# Patient Record
Sex: Female | Born: 1980 | Hispanic: No | Marital: Single | Smoking: Never smoker
Health system: Southern US, Community
[De-identification: ages and names within clinical notes are randomized; demographics above are authoritative.]

## PROBLEM LIST (undated history)

## (undated) HISTORY — PX: WISDOM TOOTH EXTRACTION: SHX21

---

## 2004-07-05 ENCOUNTER — Emergency Department (HOSPITAL_COMMUNITY): Admission: EM | Admit: 2004-07-05 | Discharge: 2004-07-05 | Payer: Self-pay

## 2012-11-10 ENCOUNTER — Emergency Department (HOSPITAL_COMMUNITY): Payer: No Typology Code available for payment source

## 2012-11-10 ENCOUNTER — Emergency Department (HOSPITAL_COMMUNITY)
Admission: EM | Admit: 2012-11-10 | Discharge: 2012-11-11 | Disposition: A | Discharge status: Home / Self Care | Payer: No Typology Code available for payment source | Attending: Emergency Medicine | Admitting: Emergency Medicine

## 2012-11-10 ENCOUNTER — Encounter (HOSPITAL_COMMUNITY): Payer: Self-pay

## 2012-11-10 DIAGNOSIS — Y9389 Activity, other specified: Secondary | ICD-10-CM | POA: Insufficient documentation

## 2012-11-10 DIAGNOSIS — R109 Unspecified abdominal pain: Secondary | ICD-10-CM | POA: Insufficient documentation

## 2012-11-10 DIAGNOSIS — R0789 Other chest pain: Secondary | ICD-10-CM | POA: Insufficient documentation

## 2012-11-10 DIAGNOSIS — S20219A Contusion of unspecified front wall of thorax, initial encounter: Secondary | ICD-10-CM | POA: Insufficient documentation

## 2012-11-10 DIAGNOSIS — Y92410 Unspecified street and highway as the place of occurrence of the external cause: Secondary | ICD-10-CM | POA: Insufficient documentation

## 2012-11-10 LAB — BASIC METABOLIC PANEL
CO2: 23 mEq/L (ref 19–32)
Chloride: 103 mEq/L (ref 96–112)
GFR calc Af Amer: >90 mL/min (ref >90)
Glucose, Bld: 160 mg/dL — ABNORMAL HIGH (ref 70–99)
Sodium: 136 mEq/L (ref 135–145)

## 2012-11-10 LAB — CBC WITH DIFFERENTIAL/PLATELET
Basophils Relative: 0 % (ref 0–1)
Eosinophils Absolute: 0.1 10*3/uL (ref 0.0–0.7)
HCT: 32.4 % — ABNORMAL LOW (ref 36.0–46.0)
Lymphocytes Relative: 19 % (ref 12–46)
Lymphs Abs: 2.3 10*3/uL (ref 0.7–4.0)
MCH: 30.6 pg (ref 26.0–34.0)
MCHC: 34 g/dL (ref 30.0–36.0)
MCV: 90.3 fL (ref 78.0–100.0)
Neutro Abs: 8.8 10*3/uL — ABNORMAL HIGH (ref 1.7–7.7)
RBC: 3.59 MIL/uL — ABNORMAL LOW (ref 3.87–5.11)
RDW: 13 % (ref 11.5–15.5)
WBC: 12.2 10*3/uL — ABNORMAL HIGH (ref 4.0–10.5)

## 2012-11-10 NOTE — ED Notes (Signed)
I gave the patient a warm blanket. 

## 2012-11-10 NOTE — ED Notes (Signed)
RR OB at bedside  
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
## 2012-11-11 ENCOUNTER — Emergency Department (HOSPITAL_COMMUNITY): Payer: No Typology Code available for payment source

## 2012-11-11 LAB — CBC
Hemoglobin: 10.7 g/dL — ABNORMAL LOW (ref 12.0–15.0)
MCV: 91 fL (ref 78.0–100.0)
RBC: 3.45 MIL/uL — ABNORMAL LOW (ref 3.87–5.11)
RDW: 13.2 % (ref 11.5–15.5)

## 2012-11-11 NOTE — Progress Notes (Signed)
Notified Dr. Macon Large with OB ultrasound results.  Patient is discharged OB status. Patient needs to notify Primary OB 11-11-2012 morning to have follow-up 11-11-2012.

## 2012-11-11 NOTE — ED Notes (Signed)
Pt dc to home.  Pt states understanding to dc instructions.   Pt ambulatory to exit without difficulty.  Pt denied need for w/c 

## 2012-11-11 NOTE — Progress Notes (Signed)
Given status update to [employer]

## 2012-11-11 NOTE — ED Provider Notes (Signed)
  Physical Exam  BP 121/70  Pulse 92  Temp 97.5 F (36.4 C) (Oral)  Resp 18  Ht 5\' 3"  (1.6 m)  Wt 150 lb (68.04 kg)  BMI 26.57 kg/m2  SpO2 99%  Physical Exam  ED Course  Procedures  MDM Change of shift, patient has been ambulatory with minimal difficulty, OB has cleared the patient for discharge off of the fetal monitor, repeat CBC shows no significant drop in clubbing, patient appears stable for discharge.      Vida Roller, MD 11/11/12 [phone]
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
## 2012-11-11 NOTE — Progress Notes (Signed)
Monitors off at this time

## 2012-11-11 NOTE — Progress Notes (Signed)
Orders placed for OB limited ultrasound

## 2013-09-15 ENCOUNTER — Encounter (HOSPITAL_COMMUNITY): Payer: Self-pay | Admitting: *Deleted

## 2014-09-28 ENCOUNTER — Encounter (HOSPITAL_COMMUNITY): Payer: Self-pay | Admitting: *Deleted

## 2021-04-29 ENCOUNTER — Encounter: Payer: Self-pay | Admitting: Neurology
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
## 2021-07-20 ENCOUNTER — Encounter: Payer: Self-pay | Admitting: Neurology

## 2021-07-20 ENCOUNTER — Ambulatory Visit (INDEPENDENT_AMBULATORY_CARE_PROVIDER_SITE_OTHER): Payer: BLUE CROSS/BLUE SHIELD | Admitting: Neurology

## 2021-07-20 ENCOUNTER — Other Ambulatory Visit: Payer: Self-pay

## 2021-07-20 VITALS — BP 129/57 | HR 70 | Ht 63.0 in | Wt 159.6 lb

## 2021-07-20 DIAGNOSIS — G43019 Migraine without aura, intractable, without status migrainosus: Secondary | ICD-10-CM | POA: Diagnosis not present

## 2021-07-20 DIAGNOSIS — M5481 Occipital neuralgia: Secondary | ICD-10-CM

## 2021-07-20 MED ORDER — SUMATRIPTAN SUCCINATE 50 MG PO TABS: ORAL_TABLET | ORAL | 5 refills | Status: AC

## 2021-07-20 MED ORDER — GABAPENTIN 100 MG PO CAPS: 100.0000 mg | ORAL_CAPSULE | Freq: Three times a day (TID) | ORAL | 5 refills | Status: AC | PRN

## 2021-07-20 NOTE — Patient Instructions (Addendum)
Take sumatriptan 50mg  at earliest onset of headache.  May repeat every 2 hours.  Maximum 4 tablets in 24 hours. If the burning and numbness in back of head is too painful, may take gabapentin 100mg  as directed If headaches become more frequent, contact me and we can start a daily medication.    1. U?ng sumatriptan 50mg  khi b?t ??u nh?c ??u s?m nh?t. C th? l?p l?i sau m?i 2 gi?. T?i ?a 4 vin trong 24 gi?. 2. N?u ?au rt v t sau ??u, c th? dng gabapentin 100mg  theo ch? d?n 3. N?u c?n ?au ??u tr? nn th??ng xuyn h?n, hy lin h? v?i ti v chng ti c th? b?t ??u dng thu?c hng ngy.

## 2021-11-25 ENCOUNTER — Emergency Department (HOSPITAL_BASED_OUTPATIENT_CLINIC_OR_DEPARTMENT_OTHER): Payer: BLUE CROSS/BLUE SHIELD

## 2021-11-25 ENCOUNTER — Emergency Department (HOSPITAL_BASED_OUTPATIENT_CLINIC_OR_DEPARTMENT_OTHER)
Admission: EM | Admit: 2021-11-25 | Discharge: 2021-11-25 | Disposition: A | Discharge status: Home / Self Care | Payer: BLUE CROSS/BLUE SHIELD | Attending: Emergency Medicine | Admitting: Emergency Medicine

## 2021-11-25 ENCOUNTER — Other Ambulatory Visit: Payer: Self-pay

## 2021-11-25 ENCOUNTER — Encounter (HOSPITAL_BASED_OUTPATIENT_CLINIC_OR_DEPARTMENT_OTHER): Payer: Self-pay | Admitting: Emergency Medicine

## 2021-11-25 DIAGNOSIS — R519 Headache, unspecified: Secondary | ICD-10-CM | POA: Diagnosis present

## 2021-11-25 DIAGNOSIS — R42 Dizziness and giddiness: Secondary | ICD-10-CM | POA: Insufficient documentation

## 2021-11-25 DIAGNOSIS — N9489 Other specified conditions associated with female genital organs and menstrual cycle: Secondary | ICD-10-CM | POA: Insufficient documentation

## 2021-11-25 DIAGNOSIS — U071 COVID-19: Secondary | ICD-10-CM | POA: Diagnosis not present

## 2021-11-25 LAB — COMPREHENSIVE METABOLIC PANEL
ALT: 17 U/L (ref 0–44)
AST: 14 U/L — ABNORMAL LOW (ref 15–41)
Albumin: 4.1 g/dL (ref 3.5–5.0)
Alkaline Phosphatase: 56 U/L (ref 38–126)
Anion gap: 8 (ref 5–15)
BUN: 10 mg/dL (ref 6–20)
CO2: 25 mmol/L (ref 22–32)
Calcium: 8.8 mg/dL — ABNORMAL LOW (ref 8.9–10.3)
Chloride: 102 mmol/L (ref 98–111)
Creatinine, Ser: 0.5 mg/dL (ref 0.44–1.00)
GFR, Estimated: >60 mL/min (ref >60)
Glucose, Bld: 107 mg/dL — ABNORMAL HIGH (ref 70–99)
Potassium: 3.6 mmol/L (ref 3.5–5.1)
Sodium: 135 mmol/L (ref 135–145)
Total Bilirubin: 0.5 mg/dL (ref 0.3–1.2)
Total Protein: 7.7 g/dL (ref 6.5–8.1)

## 2021-11-25 LAB — CBC WITH DIFFERENTIAL/PLATELET
Abs Immature Granulocytes: 0.02 10*3/uL (ref 0.00–0.07)
Basophils Absolute: 0 10*3/uL (ref 0.0–0.1)
Basophils Relative: 0 %
Eosinophils Absolute: 0 10*3/uL (ref 0.0–0.5)
Eosinophils Relative: 0 %
HCT: 41.3 % (ref 36.0–46.0)
Hemoglobin: 13.9 g/dL (ref 12.0–15.0)
Immature Granulocytes: 0 %
Lymphocytes Relative: 22 %
Lymphs Abs: 2.4 10*3/uL (ref 0.7–4.0)
MCH: 30.1 pg (ref 26.0–34.0)
MCHC: 33.7 g/dL (ref 30.0–36.0)
MCV: 89.4 fL (ref 80.0–100.0)
Monocytes Absolute: 0.4 10*3/uL (ref 0.1–1.0)
Monocytes Relative: 4 %
Neutro Abs: 8.1 10*3/uL — ABNORMAL HIGH (ref 1.7–7.7)
Neutrophils Relative %: 74 %
Platelets: 294 10*3/uL (ref 150–400)
RBC: 4.62 MIL/uL (ref 3.87–5.11)
RDW: 12 % (ref 11.5–15.5)
WBC: 11 10*3/uL — ABNORMAL HIGH (ref 4.0–10.5)
nRBC: 0 % (ref 0.0–0.2)

## 2021-11-25 LAB — RESP PANEL BY RT-PCR (FLU A&B, COVID) ARPGX2
Influenza A by PCR: NEGATIVE
Influenza B by PCR: NEGATIVE
SARS Coronavirus 2 by RT PCR: POSITIVE — AB

## 2021-11-25 LAB — HCG, SERUM, QUALITATIVE: Preg, Serum: NEGATIVE

## 2021-11-25 MED ORDER — BUTALBITAL-APAP-CAFFEINE 50-325-40 MG PO TABS: 1.0000 | ORAL_TABLET | Freq: Four times a day (QID) | ORAL | 0 refills | Status: AC | PRN

## 2021-11-25 MED ORDER — KETOROLAC TROMETHAMINE 30 MG/ML IJ SOLN
30.0000 mg | Freq: Once | INTRAMUSCULAR | Status: AC
  Administered 2021-11-25: 22:00:00 30 mg via INTRAVENOUS
  Filled 2021-11-25: qty 1

## 2021-11-25 MED ORDER — ACETAMINOPHEN 325 MG PO TABS
650.0000 mg | ORAL_TABLET | Freq: Once | ORAL | Status: AC
  Administered 2021-11-25: 21:00:00 650 mg via ORAL
  Filled 2021-11-25: qty 2

## 2021-11-25 MED ORDER — METOCLOPRAMIDE HCL 5 MG/ML IJ SOLN
5.0000 mg | Freq: Once | INTRAMUSCULAR | Status: AC
  Administered 2021-11-25: 21:00:00 5 mg via INTRAVENOUS
  Filled 2021-11-25: qty 2

## 2021-11-25 MED ORDER — MAGNESIUM SULFATE 2 GM/50ML IV SOLN
2.0000 g | Freq: Once | INTRAVENOUS | Status: AC
  Administered 2021-11-25: 21:00:00 2 g via INTRAVENOUS
  Filled 2021-11-25: qty 50

## 2021-11-25 MED ORDER — SODIUM CHLORIDE 0.9 % IV BOLUS
1000.0000 mL | Freq: Once | INTRAVENOUS | Status: AC
  Administered 2021-11-25: 20:00:00 1000 mL via INTRAVENOUS

## 2021-11-25 MED ORDER — ALBUTEROL SULFATE HFA 108 (90 BASE) MCG/ACT IN AERS: 1.0000 | INHALATION_SPRAY | Freq: Four times a day (QID) | RESPIRATORY_TRACT | 0 refills | Status: AC | PRN

## 2021-11-25 MED ORDER — DIPHENHYDRAMINE HCL 50 MG/ML IJ SOLN
25.0000 mg | Freq: Once | INTRAMUSCULAR | Status: AC
  Administered 2021-11-25: 21:00:00 25 mg via INTRAVENOUS
  Filled 2021-11-25: qty 1

## 2021-11-25 MED ORDER — ONDANSETRON HCL 4 MG PO TABS: 4.0000 mg | ORAL_TABLET | ORAL | 0 refills | Status: AC | PRN

## 2021-11-25 MED ORDER — NIRMATRELVIR/RITONAVIR (PAXLOVID)TABLET: 3.0000 | ORAL_TABLET | Freq: Two times a day (BID) | ORAL | 0 refills | Status: AC

## 2021-11-25 MED ORDER — ONDANSETRON HCL 4 MG/2ML IJ SOLN
4.0000 mg | Freq: Once | INTRAMUSCULAR | Status: AC
  Administered 2021-11-25: 21:00:00 4 mg via INTRAVENOUS
  Filled 2021-11-25: qty 2

## 2021-11-25 MED ORDER — SODIUM CHLORIDE 0.9 % IV BOLUS
1000.0000 mL | Freq: Once | INTRAVENOUS | Status: AC
  Administered 2021-11-25: 22:00:00 1000 mL via INTRAVENOUS

## 2021-11-25 NOTE — ED Notes (Signed)
Patients husband and brother at bedside. Patient appears weak and states she is very dizzy.

## 2021-11-25 NOTE — ED Notes (Signed)
Refreshments provided 

## 2021-11-25 NOTE — ED Provider Notes (Signed)
[facility] EMERGENCY DEPARTMENT Provider Note   CSN: [id number] Arrival date & time: 11/25/21  1425     History Chief Complaint  Patient presents with   Headache   Dizziness    Gina Berry is a 40 y.o. female.  This is a 40 y.o. female with significant medical history as below, including migraine with aura who presents to the ED with complaint of headache.  Patient reports headache began this morning, similar to prior headaches.  Associated with dizziness, nausea, vomiting.  She does not take any medications today for headache symptoms.  She was started on Imitrex by neurology in the past but she has not taken it due to side effects.  She is been having some difficulty walking secondary to headache, dizziness.  Dizziness feels like a room spinning sensation.  Worse with head turning and ambulation.  Reduced oral intake past 24 hours secondary to headache, feeling unwell.  No fevers or chills, no neck discomfort, no IV drug use, no trauma.  Headache does feel somewhat similar to typical migraine headaches with aura.  Headache not described as sudden onset or maximal at onset.  Headache pain onset was gradual, described as "pain."  Patient unable to characterize any further. No further complaints offered.  Translation services offered however patient prefers to have spouse translate for her  The history is provided by the patient and the spouse. The history is limited by a language barrier. No language interpreter was used.  Headache Associated symptoms: dizziness, nausea and vomiting   Associated symptoms: no abdominal pain, no back pain, no cough and no fever   Dizziness Associated symptoms: headaches, nausea and vomiting   Associated symptoms: no chest pain, no palpitations and no shortness of breath       Past Medical History:  Diagnosis Date   Migraine     There are no problems to display for this patient.   Past Surgical History:  Procedure Laterality Date    WISDOM TOOTH EXTRACTION       OB History     Gravida  1   Para      Term      Preterm      AB      Living         SAB      IAB      Ectopic      Multiple      Live Births              Family History  Problem Relation Age of Onset   Diabetes Father     Social History   Tobacco Use   Smoking status: Never   Smokeless tobacco: Never  Substance Use Topics   Alcohol use: No   Drug use: No    Home Medications Prior to Admission medications   Medication Sig Start Date End Date Taking? Authorizing Provider  ondansetron (ZOFRAN) 4 MG tablet Take 1 tablet (4 mg total) by mouth every 4 (four) hours as needed for nausea or vomiting. 11/25/21  Yes Tanda Rockers A, DO  albuterol (VENTOLIN HFA) 108 (90 Base) MCG/ACT inhaler Inhale 1-2 puffs into the lungs every 6 (six) hours as needed for wheezing or shortness of breath. 11/25/21  Yes Sloan Leiter, DO  butalbital-acetaminophen-caffeine (FIORICET) [phone] MG tablet Take 1-2 tablets by mouth every 6 (six) hours as needed for headache. 11/25/21 11/25/22 Yes Tanda Rockers A, DO  gabapentin (NEURONTIN) 100 MG capsule Take 1 capsule (100 mg total)  by mouth 3 (three) times daily as needed. 07/20/21   Drema Dallas, DO  nirmatrelvir/ritonavir EUA (PAXLOVID) 20 x 150 MG & 10 x  TABS Take 3 tablets by mouth 2 (two) times daily for 5 days. Take nirmatrelvir (150 mg) two tablets twice daily for 5 days and ritonavir (100 mg) one tablet twice daily for 5 days. 11/25/21 11/30/21 Yes Tanda Rockers A, DO  SUMAtriptan (IMITREX) 50 MG tablet Take 1 tablet earliest onset of headache.  May repeat every 2 hours.  Maximum 4 tablets in 24 hours. 07/20/21   Drema Dallas, DO    Allergies    Patient has no known allergies.  Review of Systems   Review of Systems  Constitutional:  Negative for activity change and fever.  HENT:  Negative for facial swelling and trouble swallowing.   Eyes:  Negative for discharge and redness.  Respiratory:   Negative for cough and shortness of breath.   Cardiovascular:  Negative for chest pain and palpitations.  Gastrointestinal:  Positive for nausea and vomiting. Negative for abdominal pain.  Genitourinary:  Negative for dysuria and flank pain.  Musculoskeletal:  Negative for back pain and gait problem.  Skin:  Negative for pallor and rash.  Neurological:  Positive for dizziness and headaches. Negative for syncope.   Physical Exam Updated Vital Signs BP 120/76    Pulse 79    Temp 98.1 F (36.7 C) (Oral)    Resp 14    Ht  (1.6 m)    Wt 68 kg    SpO2 100%    BMI 26.57 kg/m   Physical Exam Vitals and nursing note reviewed.  Constitutional:      General: She is not in acute distress.    Appearance: Normal appearance. She is not ill-appearing, toxic-appearing or diaphoretic.  HENT:     Head: Normocephalic and atraumatic.     Right Ear: External ear normal.     Left Ear: External ear normal.     Nose: Nose normal.     Mouth/Throat:     Mouth: Mucous membranes are moist.  Eyes:     General: No scleral icterus.       Right eye: No discharge.        Left eye: No discharge.     Extraocular Movements: Extraocular movements intact.     Pupils: Pupils are equal, round, and reactive to light.  Neck:     Meningeal: Brudzinski's sign absent.  Cardiovascular:     Rate and Rhythm: Normal rate and regular rhythm.     Pulses: Normal pulses.     Heart sounds: Normal heart sounds.  Pulmonary:     Effort: Pulmonary effort is normal. No respiratory distress.     Breath sounds: Normal breath sounds.  Abdominal:     General: Abdomen is flat.     Palpations: Abdomen is soft.     Tenderness: There is no abdominal tenderness.  Musculoskeletal:        General: Normal range of motion.     Cervical back: Full passive range of motion without pain, normal range of motion and neck supple. No rigidity.     Right lower leg: No edema.     Left lower leg: No edema.  Skin:    General: Skin is warm and  dry.     Capillary Refill: Capillary refill takes less than 2 seconds.  Neurological:     Mental Status: She is alert and oriented to person, place, and  time.     GCS: GCS eye subscore is 4. GCS verbal subscore is 5. GCS motor subscore is 6.     Cranial Nerves: Cranial nerves 2-12 are intact. No dysarthria or facial asymmetry.     Sensory: Sensation is intact.     Motor: Motor function is intact. No weakness, tremor or pronator drift.     Coordination: Coordination is intact. Finger-Nose-Finger Test normal.     Gait: Gait is intact. Gait normal.  Psychiatric:        Mood and Affect: Mood normal.        Behavior: Behavior normal.    ED Results / Procedures / Treatments   Labs (all labs ordered are listed, but only abnormal results are displayed) Labs Reviewed  RESP PANEL BY RT-PCR (FLU A&B, COVID) ARPGX2 - Abnormal; Notable for the following components:      Result Value   SARS Coronavirus 2 by RT PCR POSITIVE (*)    All other components within normal limits  CBC WITH DIFFERENTIAL/PLATELET - Abnormal; Notable for the following components:   WBC 11.0 (*)    Neutro Abs 8.1 (*)    All other components within normal limits  COMPREHENSIVE METABOLIC PANEL - Abnormal; Notable for the following components:   Glucose, Bld 107 (*)    Calcium 8.8 (*)    AST 14 (*)    All other components within normal limits  HCG, SERUM, QUALITATIVE    EKG EKG Interpretation  Date/Time:  Friday November 25 2021 20:19:27 EST Ventricular Rate:  71 PR Interval:  134 QRS Duration: 96 QT Interval:  405 QTC Calculation: 441 R Axis:   48 Text Interpretation: Sinus rhythm Low voltage, precordial leads Borderline T abnormalities, anterior leads no prior tracing available to compare Confirmed by Tanda Rockers ([id number]) on 11/25/2021 9:30:35 PM  Radiology CT Head Wo Contrast  Result Date: 11/25/2021 CLINICAL DATA:  Headache and dizziness. EXAM: CT HEAD WITHOUT CONTRAST TECHNIQUE: Contiguous axial images were  obtained from the base of the skull through the vertex without intravenous contrast. COMPARISON:  None. FINDINGS: Brain: No evidence of acute infarction, hemorrhage, hydrocephalus, extra-axial collection or mass lesion/mass effect. There is trace calcification in the posterior right basal ganglia. Vascular: No hyperdense vessel or unexpected calcification. Skull: Normal. Negative for fracture or focal lesion. Sinuses/Orbits: Visualized sinuses and mastoid air cells are clear. Orbital contents are unremarkable. Other: None. IMPRESSION: No acute intracranial CT findings. Electronically Signed   By: Almira Bar M.D.   On: 11/25/2021 21:21    Procedures Procedures   Medications Ordered in ED Medications  sodium chloride 0.9 % bolus 1,000 mL (0 mLs Intravenous Stopped 11/25/21 2142)  metoCLOPramide (REGLAN) injection 5 mg (5 mg Intravenous Given 11/25/21 2034)  diphenhydrAMINE (BENADRYL) injection 25 mg (25 mg Intravenous Given 11/25/21 2032)  magnesium sulfate IVPB 2 g 50 mL (0 g Intravenous Stopped 11/25/21 2131)  ondansetron (ZOFRAN) injection 4 mg (4 mg Intravenous Given 11/25/21 2031)  acetaminophen (TYLENOL) tablet 650 mg (650 mg Oral Given 11/25/21 2035)  ketorolac (TORADOL) 30 MG/ML injection 30 mg (30 mg Intravenous Given 11/25/21 2136)  sodium chloride 0.9 % bolus 1,000 mL (1,000 mLs Intravenous New Bag/Given 11/25/21 2136)    ED Course  I have reviewed the triage vital signs and the nursing notes.  Pertinent labs & imaging results that were available during my care of the patient were reviewed by me and considered in my medical decision making (see chart for details).    MDM Rules/Calculators/A&P  CC: ha  This patient complains of ha; this involves an extensive number of treatment options and is a complaint that carries with it a high risk of complications and morbidity. Vital signs were reviewed. Serious etiologies considered.  Record review:    Previous records obtained and reviewed   Additional history obtained from spouse  Work up as above, notable for:  Labs & imaging results that were available during my care of the patient were reviewed by me and considered in my medical decision making.   I ordered imaging studies which included CT head and I independently visualized and interpreted imaging which showed no acute process  Management: Give IVF, migraine cocktail  Patient's headache does feel worse than her typical, some atypical symptoms.  Will obtain CT head.  Labs reviewed.  Patient positive COVID-19.  She has no respiratory complaints.  She is breathing comfortably on room air.  She is able to tolerate oral intake after intervention.  Reassessment:  Patient reports that she is feeling much better.  She is able to ambulate.  Able to tolerate oral intake.  No further nausea.  Dizziness has resolved.  Symptoms likely secondary to COVID-19, migraine.  Recommend patient follow-up with neurology regarding migraine care as she has been unable to take her home Imitrex secondary to side effect profile.   We will start patient on Paxlovid after discussing medication with the patient and family.  Discharge patient home with supportive care and strict return precautions.  The patient improved significantly and was discharged in stable condition. Detailed discussions were had with the patient regarding current findings, and need for close f/u with PCP or on call doctor. The patient has been instructed to return immediately if the symptoms worsen in any way for re-evaluation. Patient verbalized understanding and is in agreement with current care plan. All questions answered prior to discharge.           This chart was dictated using voice recognition software.  Despite best efforts to proofread,  errors can occur which can change the documentation meaning.    Final Clinical Impression(s) / ED Diagnoses Final diagnoses:   COVID-19  Acute nonintractable headache, unspecified headache type    Rx / DC Orders ED Discharge Orders          Ordered    nirmatrelvir/ritonavir EUA (PAXLOVID) 20 x 150 MG & 10 x 100MG  TABS  2 times daily        11/25/21 2218    butalbital-acetaminophen-caffeine (FIORICET) 50-325-40 MG tablet  Every 6 hours PRN        11/25/21 2218    albuterol (VENTOLIN HFA) 108 (90 Base) MCG/ACT inhaler  Every 6 hours PRN        11/25/21 2218    ondansetron (ZOFRAN) 4 MG tablet  Every 4 hours PRN        11/25/21 2225             11/27/21, DO 11/25/21 2227

## 2021-11-25 NOTE — ED Notes (Signed)
Discharge instructions discussed with pt. Pt verbalized understanding. Pt stable and ambulatory.  °

## 2021-11-25 NOTE — ED Triage Notes (Addendum)
Pt to ED via [facility] w/ c/o HA, dizziness and vomiting that started this morning; hx of similar episode x1 in the past; received Zofran 4mg  IV from EMS

## 2021-11-25 NOTE — Discharge Instructions (Addendum)
Please follow-up with neurology regarding your migraines/headaches.  Return to emergency room for any worsening or worrisome symptoms.

## 2022-01-30 ENCOUNTER — Ambulatory Visit: Payer: BLUE CROSS/BLUE SHIELD | Admitting: Neurology

## 2022-09-18 IMAGING — CT CT HEAD W/O CM
3 series · 16 of 47 positions shown, 19 images · non-contrast
Comparison: None.

CLINICAL DATA: Headache and dizziness.

EXAM:
CT HEAD WITHOUT CONTRAST
TECHNIQUE: Contiguous axial images were obtained from the base of the skull
through the vertex without intravenous contrast.

[Series 2: head wo · axial · 0.41mm/px · z∈[+794,+934]mm · 10 of 34 slices shown, 13 images]
[im 3/34  brain]
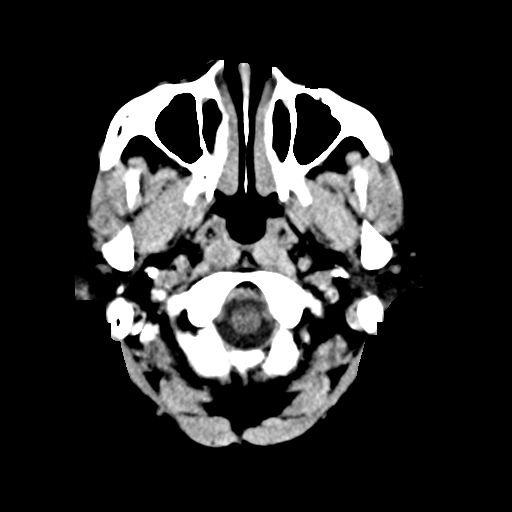
[im 3/34  bone]
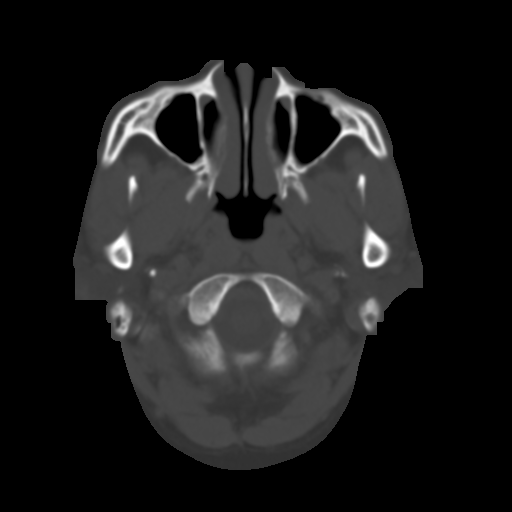
[im 6/34  brain]
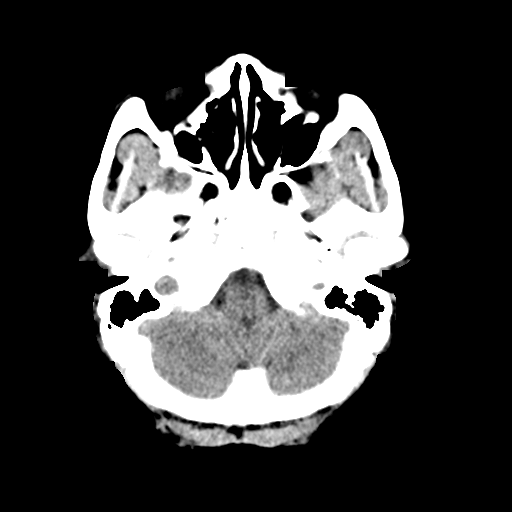
[im 10/34  brain]
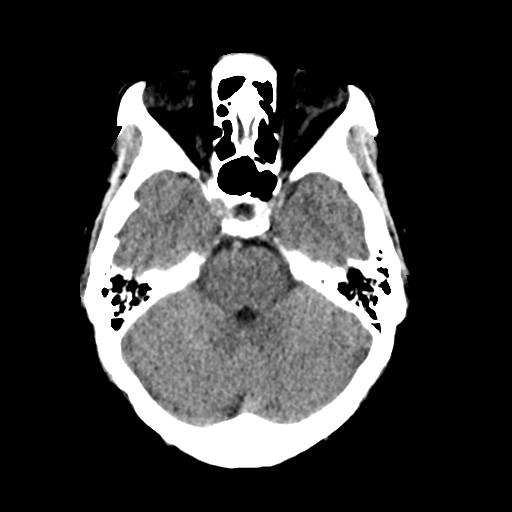
[im 12/34  brain]
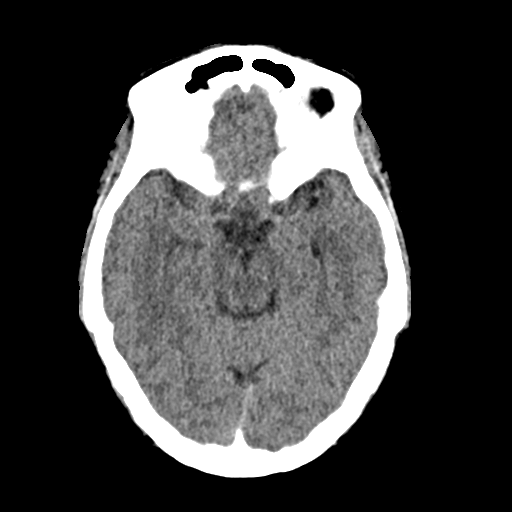
[im 15/34  brain]
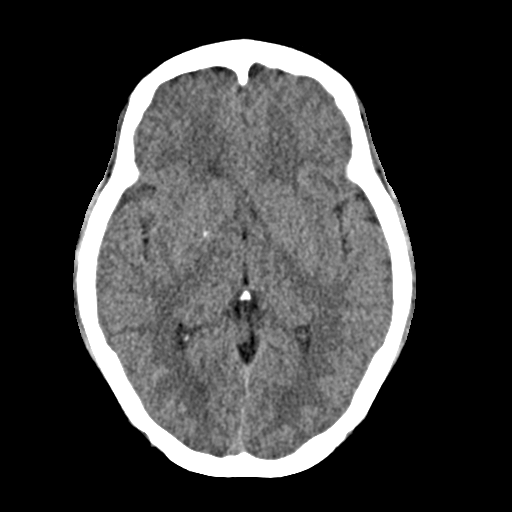
[im 15/34  bone]
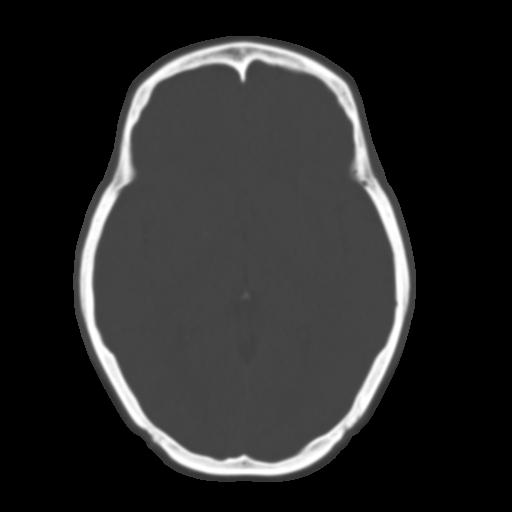
[im 19/34  brain]
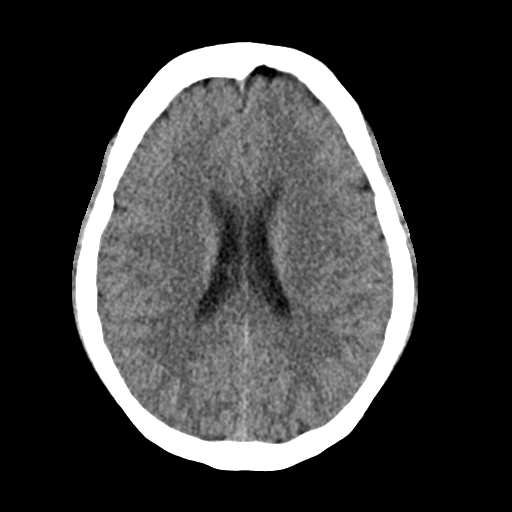
[im 22/34  brain]
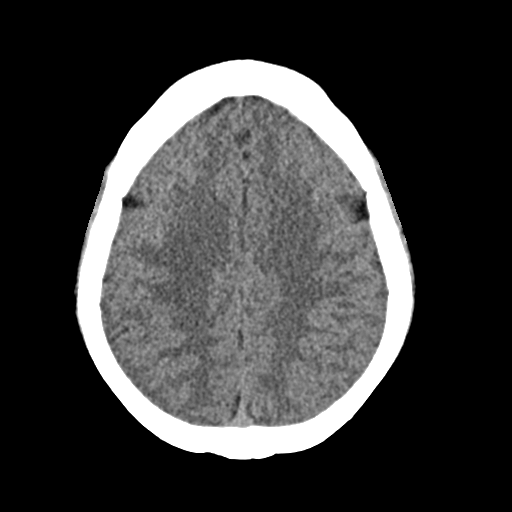
[im 26/34  brain]
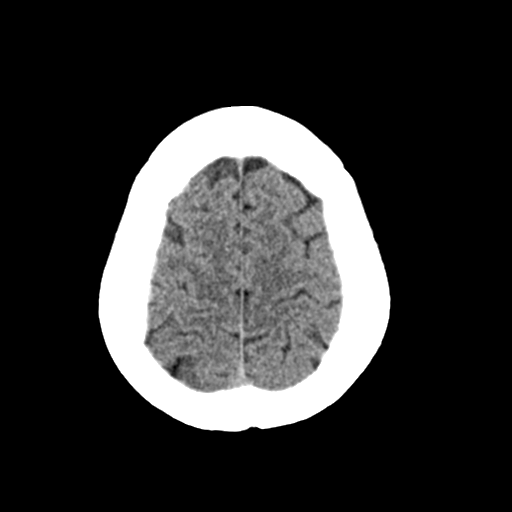
[im 28/34  brain]
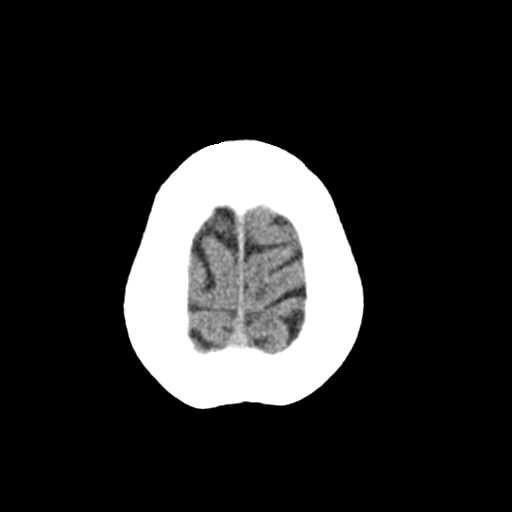
[im 28/34  bone]
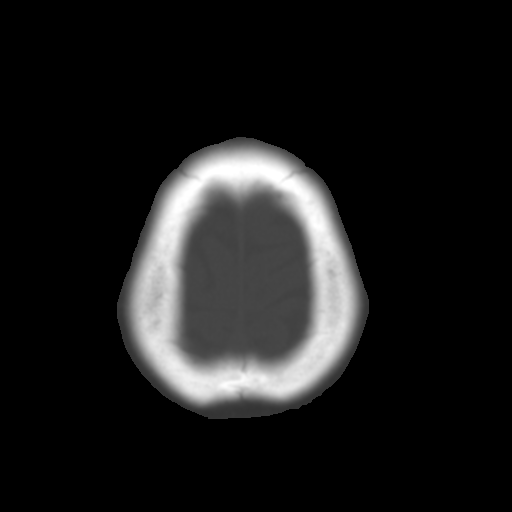
[im 31/34  brain]
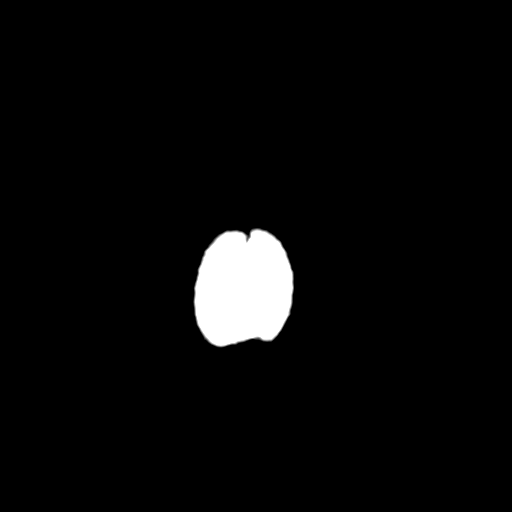

[Series 4: coronal soft · coronal · 0.32mm/px · 3 of 69 slices shown]
[im 23/69  brain]
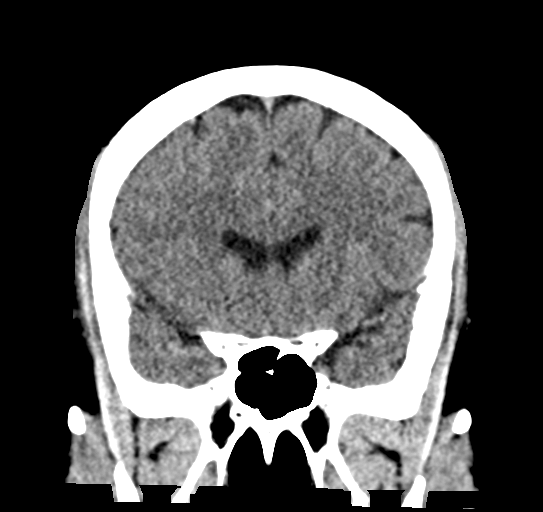
[im 31/69  brain]
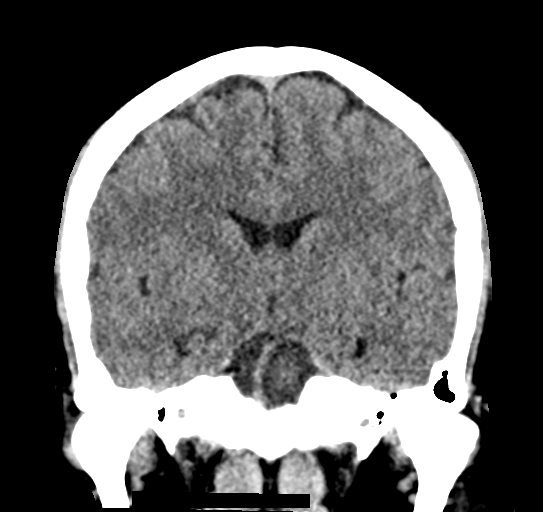
[im 38/69  brain]
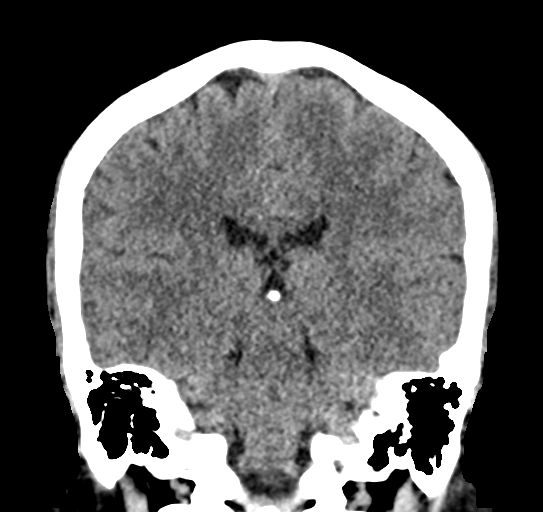

[Series 5: sag soft · sagittal · 0.32mm/px · 3 of 58 slices shown]
[im 20/58  brain]
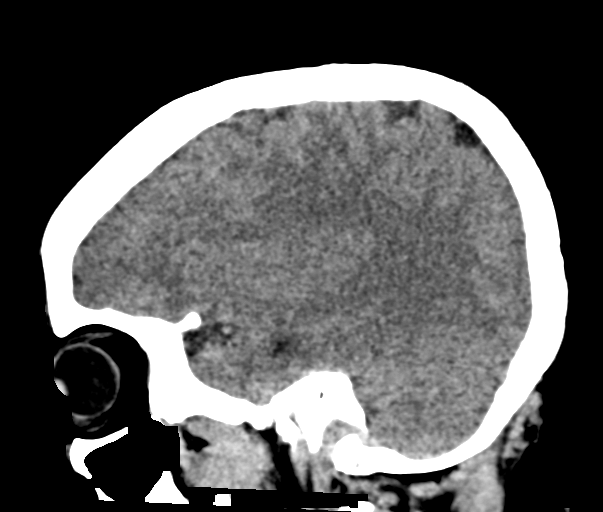
[im 29/58  brain]
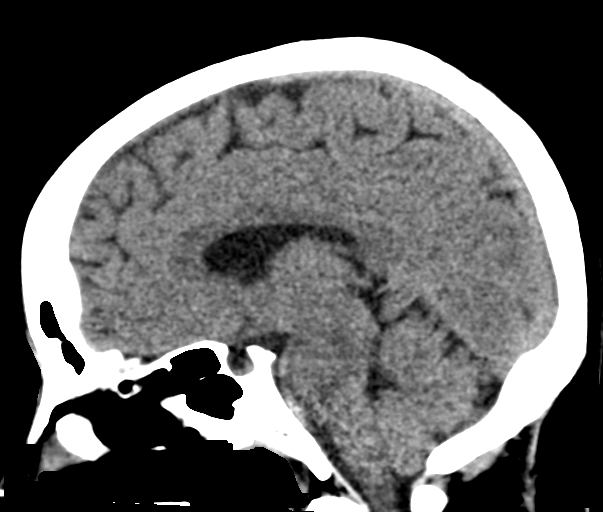
[im 39/58  brain]
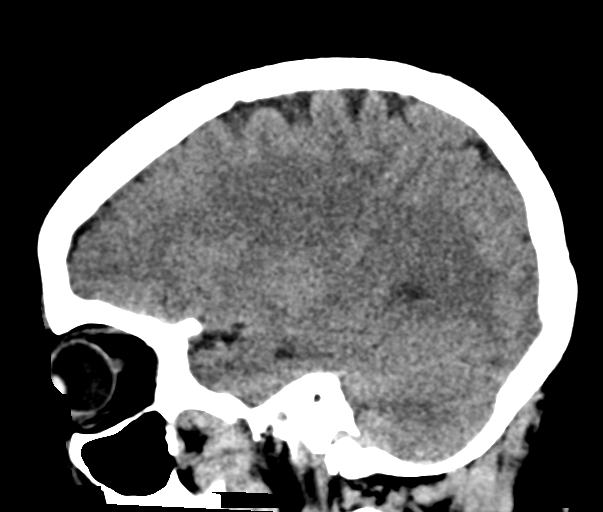

[16 of 47 positions shown; findings below may reference images not displayed]

FINDINGS: Brain: No evidence of acute infarction, hemorrhage, hydrocephalus,
extra-axial collection or mass lesion/mass effect. There is trace
calcification in the posterior right basal ganglia.

Vascular: No hyperdense vessel or unexpected calcification.

Skull: Normal. Negative for fracture or focal lesion.

Sinuses/Orbits: Visualized sinuses and mastoid air cells are clear.
Orbital contents are unremarkable.

Other: None.
IMPRESSION: No acute intracranial CT findings.
